# Patient Record
Sex: Male | Born: 1965 | Race: White | Hispanic: No | Marital: Single | Smoking: Never smoker
Health system: Southern US, Community
[De-identification: ages and names within clinical notes are randomized; demographics above are authoritative.]

---

## 2019-05-16 ENCOUNTER — Other Ambulatory Visit: Payer: Self-pay

## 2019-05-16 DIAGNOSIS — Z20822 Contact with and (suspected) exposure to covid-19: Secondary | ICD-10-CM

## 2019-05-18 LAB — NOVEL CORONAVIRUS, NAA: SARS-CoV-2, NAA: NOT DETECTED

## 2019-09-22 ENCOUNTER — Emergency Department (HOSPITAL_COMMUNITY): Payer: 59

## 2019-09-22 ENCOUNTER — Emergency Department (HOSPITAL_COMMUNITY)
Admission: EM | Admit: 2019-09-22 | Discharge: 2019-09-22 | Disposition: A | Discharge status: Home / Self Care | Payer: 59 | Attending: Emergency Medicine | Admitting: Emergency Medicine

## 2019-09-22 ENCOUNTER — Encounter (HOSPITAL_COMMUNITY): Payer: Self-pay | Admitting: Pediatrics

## 2019-09-22 ENCOUNTER — Other Ambulatory Visit: Payer: Self-pay

## 2019-09-22 DIAGNOSIS — R05 Cough: Secondary | ICD-10-CM | POA: Insufficient documentation

## 2019-09-22 DIAGNOSIS — R0602 Shortness of breath: Secondary | ICD-10-CM | POA: Diagnosis not present

## 2019-09-22 DIAGNOSIS — U071 COVID-19: Secondary | ICD-10-CM

## 2019-09-22 MED ORDER — DOXYCYCLINE HYCLATE 100 MG PO CAPS: 100.0000 mg | ORAL_CAPSULE | Freq: Two times a day (BID) | ORAL | 0 refills | Status: AC

## 2019-09-22 MED ORDER — DEXAMETHASONE 4 MG PO TABS
10.0000 mg | ORAL_TABLET | Freq: Once | ORAL | Status: AC
  Administered 2019-09-22: 10 mg via ORAL
  Filled 2019-09-22: qty 3

## 2019-09-22 MED ORDER — DOXYCYCLINE HYCLATE 100 MG PO TABS
100.0000 mg | ORAL_TABLET | Freq: Once | ORAL | Status: AC
  Administered 2019-09-22: 17:00:00 100 mg via ORAL
  Filled 2019-09-22: qty 1

## 2019-09-22 MED ORDER — ACETAMINOPHEN 325 MG PO TABS
650.0000 mg | ORAL_TABLET | Freq: Once | ORAL | Status: AC
  Administered 2019-09-22: 650 mg via ORAL
  Filled 2019-09-22: qty 2

## 2019-09-22 NOTE — ED Notes (Signed)
Patient verbalizes understanding of discharge instructions. Opportunity for questioning and answers were provided. Pt discharged from ED. 

## 2019-09-22 NOTE — ED Triage Notes (Signed)
Patient stated tested + for Covid last Friday 09/15/2019; stated symptoms started last Monday 09/11/2019; and hasn't felt better since. C/o cough, congestion and fever.

## 2019-09-22 NOTE — ED Notes (Signed)
Instructed patient to walk in place; noted Sp02 remained 94-95% no obvious distress noted.

## 2019-09-22 NOTE — ED Provider Notes (Signed)
[facility] EMERGENCY DEPARTMENT Provider Note   CSN: [id number] Arrival date & time: 09/22/19  1331     History Chief Complaint  Patient presents with  . Cough    Bradley Pollard is a 54 y.o. male who tested positive for Covid on 09/15/2019 presents to emergency department today with chief complaint of worsening Covid symptoms x1 week  Patient tested positive at his PCP office.  He states ever since his diagnosis he has had persistent nonproductive cough, shortness of breath and generalized weakness.  He has decreased appetite but denies any nausea or emesis.  He has not been eating as much but is doing his best to stay well-hydrated.  He has a thermometer at home but has not checked his temperature.  He has been taking an over-the-counter multivitamin and sporadically taking Tylenol.  No Tylenol in the last x 2 days.  He denies chills, pain, chest pain, palpitations, syncope abdominal pain, urinary symptoms, diarrhea, numbness, weakness.  History provided by patient with additional history obtained from chart review.     History reviewed. No pertinent past medical history.  There are no problems to display for this patient.   History reviewed. No pertinent surgical history.     No family history on file.  Social History   Tobacco Use  . Smoking status: Not on file  Substance Use Topics  . Alcohol use: Not on file  . Drug use: Not on file    Home Medications Prior to Admission medications   Medication Sig Start Date End Date Taking? Authorizing Provider  doxycycline (VIBRAMYCIN) 100 MG capsule Take 1 capsule (100 mg total) by mouth 2 (two) times daily for 7 days. 09/22/19 09/29/19  Corrin Hingle, Caroleen Hamman, PA-C    Allergies    Patient has no known allergies.  Review of Systems   Review of Systems All other systems are reviewed and are negative for acute change except as noted in the HPI.  Physical Exam Updated Vital Signs BP (!) 112/59 (BP Location:  Left Arm)   Pulse 87   Temp (!) 102.8 F (39.3 C) (Oral)   Resp 20   Ht 5\' 8"  (1.727 m)   Wt 93.9 kg   SpO2 95%   BMI 31.47 kg/m   Physical Exam Vitals and nursing note reviewed.  Constitutional:      General: He is not in acute distress.    Appearance: He is not ill-appearing.  HENT:     Head: Normocephalic and atraumatic.     Right Ear: Tympanic membrane and external ear normal.     Left Ear: Tympanic membrane and external ear normal.     Nose: Nose normal.     Mouth/Throat:     Mouth: Mucous membranes are moist.     Pharynx: Oropharynx is clear.  Eyes:     General: No scleral icterus.       Right eye: No discharge.        Left eye: No discharge.     Extraocular Movements: Extraocular movements intact.     Conjunctiva/sclera: Conjunctivae normal.     Pupils: Pupils are equal, round, and reactive to light.  Neck:     Vascular: No JVD.  Cardiovascular:     Rate and Rhythm: Normal rate and regular rhythm.     Pulses: Normal pulses.          Radial pulses are 2+ on the right side and 2+ on the left side.     Heart  sounds: Normal heart sounds.  Pulmonary:     Comments: Lungs clear to auscultation in all fields. Symmetric chest rise. No wheezing, rales, or rhonchi.  Normal work of breathing.  SPO2 is 99% on room air during exam. Abdominal:     Comments: Abdomen is soft, non-distended, and non-tender in all quadrants. No rigidity, no guarding. No peritoneal signs.  Musculoskeletal:        General: Normal range of motion.     Cervical back: Normal range of motion.     Right lower leg: No edema.     Left lower leg: No edema.  Skin:    General: Skin is warm and dry.     Capillary Refill: Capillary refill takes less than 2 seconds.     Findings: No rash.  Neurological:     Mental Status: He is oriented to person, place, and time.     GCS: GCS eye subscore is 4. GCS verbal subscore is 5. GCS motor subscore is 6.     Comments: Fluent speech, no facial droop.  Psychiatric:         Behavior: Behavior normal.       ED Results / Procedures / Treatments   Labs (all labs ordered are listed, but only abnormal results are displayed) Labs Reviewed - No data to display  EKG None  Radiology DG Chest Portable 1 View  Result Date: 09/22/2019 CLINICAL DATA:  Cough, congestion, COVID-19 positive EXAM: PORTABLE CHEST 1 VIEW COMPARISON:  None. FINDINGS: Single frontal view of the chest demonstrates diffuse interstitial prominence, with minimal patchy bilateral ground-glass airspace disease greatest at the left lung base. No effusion or pneumothorax. No acute bony abnormality. IMPRESSION: 1. Diffuse interstitial prominence with patchy bibasilar ground-glass airspace disease consistent with multifocal pneumonia. This is compatible with given history of COVID-19. Electronically Signed   By: Sharlet Salina M.D.   On: 09/22/2019 15:36    Procedures Procedures (including critical care time)  Medications Ordered in ED Medications  doxycycline (VIBRA-TABS) tablet 100 mg (has no administration in time range)  dexamethasone (DECADRON) tablet 10 mg (has no administration in time range)  acetaminophen (TYLENOL) tablet 650 mg (650 mg Oral Given 09/22/19 1415)    ED Course  I have reviewed the triage vital signs and the nursing notes.  Pertinent labs & imaging results that were available during my care of the patient were reviewed by me and considered in my medical decision making (see chart for details).    MDM Rules/Calculators/A&P                      I have reviewed patient's EMR to obtain pertinent PMH to assist in MDM.  Patient seen and examined. Patient presents awake, alert, hemodynamically stable, non toxic.  Patient was febrile in triage at 102.8, he was given Tylenol.  Chest xray ordered in triage and is suggestive of multifocal pneumonia.  On my exam I rechecked temperature and it was 98.4.  He is not tachycardic or hypoxic.  He is very well-appearing.  He has  normal work of breathing, lungs are clear to auscultation all fields, no tachypnea. No clinical signs of severe illness, dehydration, to warrant further emergent work up in ER.  He is tolerating p.o. intake at home and denies any nausea, emesis or diarrhea.  Patient ambulated in the emergency department without respiratory distress or hypoxia, SpO2 >95% on room air.  Given reassuring physical exam, symptoms, will discharge with symptomatic treatment and cover for  possible underlying bacterial pneumonia with prescription for doxycycline.  Patient given dose of Decadron here in the emergency department.  Recommend telemedicine PCP f/u in the next 2-3 days for persistent symptoms  for further guidance. Self-isolation instructions discussed. Pt was given home self-isolation instructions and instructions for family members.   Pt understands signs and symptoms that would warrant return to ED.  Pt comfortable and agreeable with POC.  Gillian Kluever was evaluated in Emergency Department on 09/22/2019 for the symptoms described in the history of present illness. He was evaluated in the context of the global COVID-19 pandemic, which necessitated consideration that the patient might be at risk for infection with the SARS-CoV-2 virus that causes COVID-19. Institutional protocols and algorithms that pertain to the evaluation of patients at risk for COVID-19 are in a state of rapid change based on information released by regulatory bodies including the CDC and federal and state organizations. These policies and algorithms were followed during the patient's care in the ED.   Portions of this note were generated with Lobbyist. Dictation errors may occur despite best attempts at proofreading.   Final Clinical Impression(s) / ED Diagnoses Final diagnoses:  COVID-19 virus infection    Rx / DC Orders ED Discharge Orders         Ordered    doxycycline (VIBRAMYCIN) 100 MG capsule  2 times daily      09/22/19 1620           Flint Melter 09/22/19 1645    Valarie Merino, MD 09/29/19 [phone]

## 2019-09-22 NOTE — Discharge Instructions (Addendum)
Thank you for allowing [place] to care for you today.   -A prescription has been sent to your pharmacy for doxycycline.  This is a antibiotic used to treat pneumonia.  Please take as prescribed. -Recommend that you take Tylenol for pain and fever.  Please return to the emergency department if you have any new or worsening symptoms.  You tested positive for covid-19 today.   Medications- You can take medications to help treat your symptoms: -Tylenol for fever and body aches. Please take as prescribed on the bottle. -Over the coutner cough medicine such as mucinex, robitussin, or other brands. -Flonase or saline nasal spray for nasal congestion -Vitamins as recommended by CDC  Treatment- This is a virus and unfortunately there are no antibitotics approved to treat this virus at this time. It is important to monitor your symptoms closely: -You should have a theremometer at home to check your temperature when feeling feverish. -Use a pulse ox meter to measure your oxygen when feeling short of breath.  -If your fever is over 100.4 despite taking tylenol or if your oxygen level drops below 94% these are reasons to rturn to the emergency department for further evaluation. Please call the emergency department before you come to make [place] aware.    We recommend you self-isolate for 10 days and to inform your work/family/friends that you has the virus.  They will need to self-quarantine for 14 days to monitor for symptoms.    Again: symptoms of shortness of breath, chest pain, difficulty breathing, new onset of confusion, any symptoms that are concerning. If any of these symptoms you should come to emergency department for evaluation.   I hope you feel better soon

## 2020-07-26 ENCOUNTER — Telehealth: Payer: 59 | Admitting: Nurse Practitioner

## 2020-07-26 DIAGNOSIS — J0100 Acute maxillary sinusitis, unspecified: Secondary | ICD-10-CM

## 2020-07-26 MED ORDER — AMOXICILLIN-POT CLAVULANATE 875-125 MG PO TABS: 1.0000 | ORAL_TABLET | Freq: Two times a day (BID) | ORAL | 0 refills | Status: AC

## 2020-07-26 NOTE — Progress Notes (Signed)

## 2020-08-09 ENCOUNTER — Telehealth: Payer: 59 | Admitting: Family

## 2020-08-09 DIAGNOSIS — J329 Chronic sinusitis, unspecified: Secondary | ICD-10-CM | POA: Diagnosis not present

## 2020-08-09 DIAGNOSIS — B9789 Other viral agents as the cause of diseases classified elsewhere: Secondary | ICD-10-CM | POA: Diagnosis not present

## 2020-08-09 DIAGNOSIS — J301 Allergic rhinitis due to pollen: Secondary | ICD-10-CM | POA: Diagnosis not present

## 2020-08-09 MED ORDER — CETIRIZINE HCL 10 MG PO TABS: 10.0000 mg | ORAL_TABLET | Freq: Every day | ORAL | 11 refills | Status: AC

## 2020-08-09 MED ORDER — FLUTICASONE PROPIONATE 50 MCG/ACT NA SUSP: 2.0000 | Freq: Every day | NASAL | 6 refills | Status: AC

## 2020-08-09 NOTE — Progress Notes (Signed)
We are sorry that you are not feeling well.  Here is how we plan to help!  Based on what you have shared with me it looks like you have sinusitis.  Sinusitis is inflammation and infection in the sinus cavities of the head.  Based on your presentation I believe you most likely have Acute Viral Sinusitis.This is an infection most likely caused by a virus. There is not specific treatment for viral sinusitis other than to help you with the symptoms until the infection runs its course.  You may use an oral decongestant such as Mucinex D or if you have glaucoma or high blood pressure use plain Mucinex. Saline nasal spray help and can safely be used as often as needed for congestion, I have prescribed: Fluticasone nasal spray two sprays in each nostril once a day and a zyrtec daily.  After reviewing your chart, it appears you have been given three different antibiotics over last two months. Starting the flonase and zyrtec daily should help with this flaring up. If it continues, you need to be seen face to face to be evaluated.   Some authorities believe that zinc sprays or the use of Echinacea may shorten the course of your symptoms.  Sinus infections are not as easily transmitted as other respiratory infection, however we still recommend that you avoid close contact with loved ones, especially the very young and elderly.  Remember to wash your hands thoroughly throughout the day as this is the number one way to prevent the spread of infection!  Home Care:  Only take medications as instructed by your medical team.  Do not take these medications with alcohol.  A steam or ultrasonic humidifier can help congestion.  You can place a towel over your head and breathe in the steam from hot water coming from a faucet.  Avoid close contacts especially the very young and the elderly.  Cover your mouth when you cough or sneeze.  Always remember to wash your hands.  Get Help Right Away If:  You develop  worsening fever or sinus pain.  You develop a severe head ache or visual changes.  Your symptoms persist after you have completed your treatment plan.  Make sure you  Understand these instructions.  Will watch your condition.  Will get help right away if you are not doing well or get worse.  Your e-visit answers were reviewed by a board certified advanced clinical practitioner to complete your personal care plan.  Depending on the condition, your plan could have included both over the counter or prescription medications.  If there is a problem please reply  once you have received a response from your provider.  Your safety is important to [place].  If you have drug allergies check your prescription carefully.    You can use MyChart to ask questions about today's visit, request a non-urgent call back, or ask for a work or school excuse for 24 hours related to this e-Visit. If it has been greater than 24 hours you will need to follow up with your provider, or enter a new e-Visit to address those concerns.  You will get an e-mail in the next two days asking about your experience.  I hope that your e-visit has been valuable and will speed your recovery. Thank you for using e-visits.  Approximately 5 minutes was spent documenting and reviewing patient's chart.

## 2020-11-13 IMAGING — DX DG CHEST 1V PORT
1 series · 1 of 1 positions shown · non-contrast
Comparison: None.

CLINICAL DATA: Cough, congestion, R0HOA-JG positive

EXAM:
PORTABLE CHEST 1 VIEW

[chest ap]
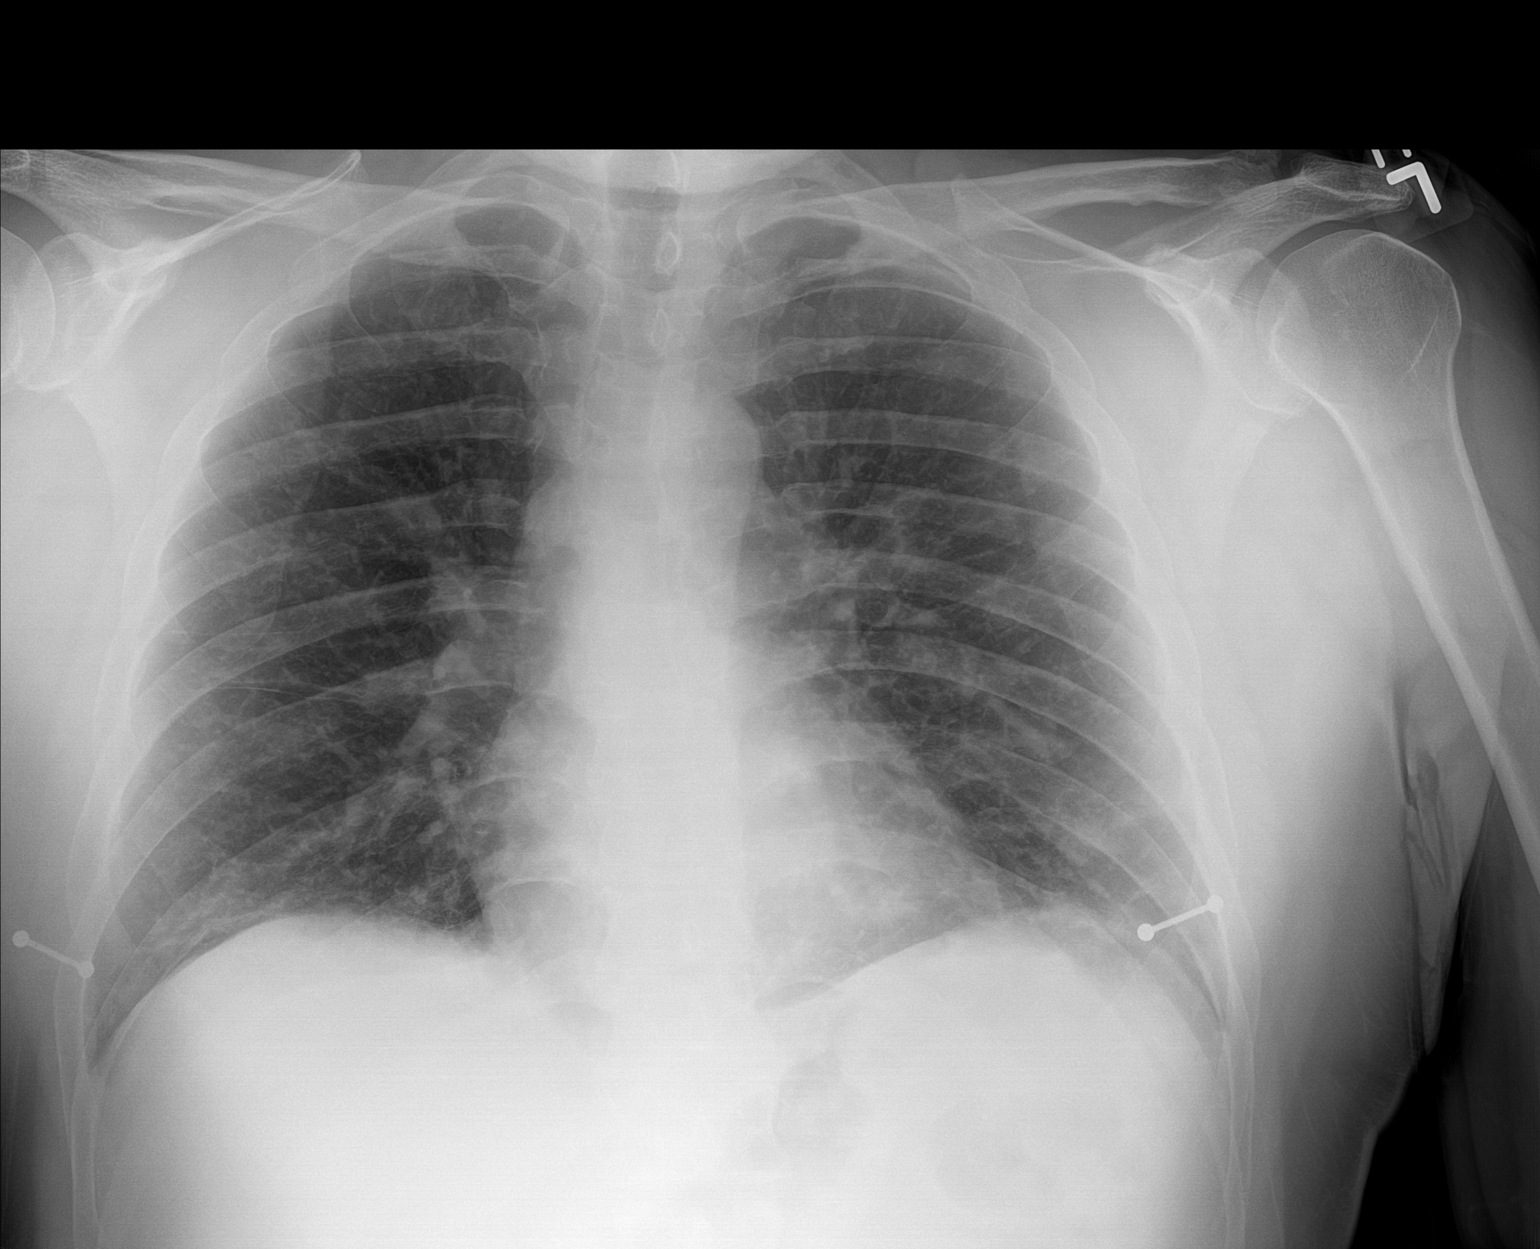

[1 of 1 positions shown; findings below may reference images not displayed]

FINDINGS: Single frontal view of the chest demonstrates diffuse interstitial
prominence, with minimal patchy bilateral ground-glass airspace
disease greatest at the left lung base. No effusion or pneumothorax.
No acute bony abnormality.
IMPRESSION: 1. Diffuse interstitial prominence with patchy bibasilar
ground-glass airspace disease consistent with multifocal pneumonia.
This is compatible with given history of R0HOA-JG.

## 2022-06-28 ENCOUNTER — Encounter (HOSPITAL_BASED_OUTPATIENT_CLINIC_OR_DEPARTMENT_OTHER): Payer: Self-pay | Admitting: Emergency Medicine

## 2022-06-28 ENCOUNTER — Emergency Department (HOSPITAL_BASED_OUTPATIENT_CLINIC_OR_DEPARTMENT_OTHER)
Admission: EM | Admit: 2022-06-28 | Discharge: 2022-06-28 | Disposition: A | Discharge status: Home / Self Care | Payer: 59 | Attending: Emergency Medicine | Admitting: Emergency Medicine

## 2022-06-28 ENCOUNTER — Other Ambulatory Visit: Payer: Self-pay

## 2022-06-28 DIAGNOSIS — J101 Influenza due to other identified influenza virus with other respiratory manifestations: Secondary | ICD-10-CM | POA: Diagnosis not present

## 2022-06-28 DIAGNOSIS — Z1152 Encounter for screening for COVID-19: Secondary | ICD-10-CM | POA: Diagnosis not present

## 2022-06-28 DIAGNOSIS — J069 Acute upper respiratory infection, unspecified: Secondary | ICD-10-CM | POA: Diagnosis present

## 2022-06-28 LAB — RESP PANEL BY RT-PCR (RSV, FLU A&B, COVID)  RVPGX2
Influenza A by PCR: POSITIVE — AB
Influenza B by PCR: NEGATIVE
Resp Syncytial Virus by PCR: NEGATIVE
SARS Coronavirus 2 by RT PCR: NEGATIVE

## 2022-06-28 MED ORDER — BENZONATATE 100 MG PO CAPS: 100.0000 mg | ORAL_CAPSULE | Freq: Three times a day (TID) | ORAL | 0 refills | Status: AC

## 2022-06-28 NOTE — ED Provider Notes (Signed)
[facility] EMERGENCY DEPT Provider Note   CSN: [id number] Arrival date & time: 06/28/22  1548     History  Chief Complaint  Patient presents with   URI    Bradley Pollard is a 56 y.o. male.  He denies any chronic medical problems.  He presents the ER for chief complaint of cough and sinus congestion.  He states this started with a cough and some chest congestion 4 days ago and now has progressed to sinus congestion as well.  He denies purulent sputum, and he denies any fevers chest pain or shortness of breath.  He reports he gets frequent sinus infections and was concerned he might need antibiotics.   URI Presenting symptoms: cough        Home Medications Prior to Admission medications   Medication Sig Start Date End Date Taking? Authorizing Provider  benzonatate (TESSALON) 100 MG capsule Take 1 capsule (100 mg total) by mouth every 8 (eight) hours. 06/28/22  Yes Alexandru Moorer A, PA-C  amoxicillin-clavulanate (AUGMENTIN) 875-125 MG tablet Take 1 tablet by mouth 2 (two) times daily. 07/26/20   Hassell Done Mary-Margaret, FNP  cetirizine (ZYRTEC) 10 MG tablet Take 1 tablet (10 mg total) by mouth daily. 08/09/20   Sharion Balloon, FNP  fluticasone (FLONASE) 50 MCG/ACT nasal spray Place 2 sprays into both nostrils daily. 08/09/20   Sharion Balloon, FNP      Allergies    Patient has no known allergies.    Review of Systems   Review of Systems  HENT:  Positive for sinus pressure.   Respiratory:  Positive for cough.     Physical Exam Updated Vital Signs BP (!) 140/77 (BP Location: Right Arm)   Pulse (!) 101   Temp 98.9 F (37.2 C)   Resp 15   Ht 5\' 9"  (1.753 m)   Wt 95.3 kg   SpO2 98%   BMI 31.01 kg/m  Physical Exam Vitals and nursing note reviewed.  Constitutional:      General: He is not in acute distress.    Appearance: He is well-developed.  HENT:     Head: Normocephalic and atraumatic.     Nose: Nose normal. No rhinorrhea.     Mouth/Throat:      Mouth: Mucous membranes are moist.     Pharynx: Oropharynx is clear. No pharyngeal swelling or oropharyngeal exudate.  Eyes:     Conjunctiva/sclera: Conjunctivae normal.  Cardiovascular:     Rate and Rhythm: Normal rate and regular rhythm.     Heart sounds: No murmur heard. Pulmonary:     Effort: Pulmonary effort is normal. No respiratory distress.     Breath sounds: Normal breath sounds.  Abdominal:     Palpations: Abdomen is soft.     Tenderness: There is no abdominal tenderness.  Musculoskeletal:        General: No swelling.     Cervical back: Neck supple.  Skin:    General: Skin is warm and dry.     Capillary Refill: Capillary refill takes less than 2 seconds.  Neurological:     Mental Status: He is alert.  Psychiatric:        Mood and Affect: Mood normal.     ED Results / Procedures / Treatments   Labs (all labs ordered are listed, but only abnormal results are displayed) Labs Reviewed  RESP PANEL BY RT-PCR (RSV, FLU A&B, COVID)  RVPGX2 - Abnormal; Notable for the following components:      Result Value  Influenza A by PCR POSITIVE (*)    All other components within normal limits    EKG None  Radiology No results found.  Procedures Procedures    Medications Ordered in ED Medications - No data to display  ED Course/ Medical Decision Making/ A&P                           Medical Decision Making Differential diagnosis, acute sinusitis, influenza, COVID-19, bronchitis, pneumonia, other ED course: Patient comes in for 4 days of sinus congestion with cough.  He has not had a fever, no chest pain or shortness of breath.  He is well-appearing with reassuring vitals.  He tested positive for influenza A.  I had a long discussion with him regarding the fact that antibiotics are not indicated at this time.  We discussed that specifically for sinusitis  which he was concerned about, we generally reserve antibiotics for 10 days of symptoms, or significant worsening of  symptoms.  He is advised if he develops fever, facial pain, or persistent symptoms or other worrisome changes she should come back otherwise we will treat conservatively.  He was instructed on saline nasal spray, using humidifier in his room, using nasal steroid spray which he reports he already uses for his symptomatic relief.  He is given Lawyer for his cough.  Lung auscultation is clear, at this time I do not feel there is need for chest x-ray given exam, lack of fever or other pulmonary complaints.  Risk Prescription drug management.           Final Clinical Impression(s) / ED Diagnoses Final diagnoses:  Influenza A    Rx / DC Orders ED Discharge Orders          Ordered    benzonatate (TESSALON) 100 MG capsule  Every 8 hours        06/28/22 2110              Josem Kaufmann 06/28/22 2116    Arby Barrette, MD 07/08/22 1724

## 2022-06-28 NOTE — Discharge Instructions (Signed)
You are seen today for cough and congestion for the past 4 days.  You have influenza.  At this time there are no signs of a bacterial illness that would benefit from antibiotics.  If your symptoms are persisting longer than expected or you develop fever or worsening symptoms make sure you have a repeat evaluation.  Come to the ER for any new or worse worrisome changes.  Drink plenty fluids, rest, use the saline nasal spray and a humidifier in your room to help with your symptoms.  You can use the Tessalon Perles to help with the cough.

## 2022-06-28 NOTE — ED Triage Notes (Signed)
Pt via pov from home with URI symptoms: chest congestion, cough, nasal congestion, feeling of fullness in head and ears. Symptoms began 4 days ago. Pt alert & oriented, nad noted.
